# Patient Record
Sex: Female | Born: 1950 | Race: White | Hispanic: No | Marital: Married
Health system: Southern US, Community
[De-identification: ages and names within clinical notes are randomized; demographics above are authoritative.]

---

## 1998-09-30 ENCOUNTER — Other Ambulatory Visit: Admission: RE | Admit: 1998-09-30 | Discharge: 1998-09-30 | Payer: Self-pay | Admitting: Obstetrics & Gynecology

## 2001-11-13 ENCOUNTER — Other Ambulatory Visit: Admission: RE | Admit: 2001-11-13 | Discharge: 2001-11-13 | Payer: Self-pay | Admitting: Obstetrics & Gynecology

## 2003-10-14 ENCOUNTER — Other Ambulatory Visit: Admission: RE | Admit: 2003-10-14 | Discharge: 2003-10-14 | Payer: Self-pay | Admitting: Obstetrics & Gynecology

## 2004-10-25 ENCOUNTER — Other Ambulatory Visit: Admission: RE | Admit: 2004-10-25 | Discharge: 2004-10-25 | Payer: Self-pay | Admitting: Obstetrics & Gynecology

## 2005-04-12 ENCOUNTER — Encounter: Admission: RE | Admit: 2005-04-12 | Discharge: 2005-04-12 | Payer: Self-pay | Admitting: Obstetrics & Gynecology

## 2005-11-09 ENCOUNTER — Encounter: Admission: RE | Admit: 2005-11-09 | Discharge: 2005-11-09 | Payer: Self-pay | Admitting: Obstetrics & Gynecology

## 2006-11-13 ENCOUNTER — Encounter: Admission: RE | Admit: 2006-11-13 | Discharge: 2006-11-13 | Payer: Self-pay | Admitting: Obstetrics & Gynecology

## 2009-09-29 ENCOUNTER — Encounter: Admission: RE | Admit: 2009-09-29 | Discharge: 2009-09-29 | Payer: Self-pay | Admitting: Obstetrics & Gynecology

## 2010-10-05 ENCOUNTER — Encounter
Admission: RE | Admit: 2010-10-05 | Discharge: 2010-10-05 | Payer: Self-pay | Source: Home / Self Care | Admitting: Obstetrics & Gynecology

## 2010-11-20 ENCOUNTER — Encounter: Payer: Self-pay | Admitting: Obstetrics & Gynecology

## 2011-09-07 ENCOUNTER — Other Ambulatory Visit: Payer: Self-pay | Admitting: Obstetrics & Gynecology

## 2011-09-07 DIAGNOSIS — Z1231 Encounter for screening mammogram for malignant neoplasm of breast: Secondary | ICD-10-CM

## 2011-10-18 ENCOUNTER — Ambulatory Visit
Admission: RE | Admit: 2011-10-18 | Discharge: 2011-10-18 | Disposition: A | Discharge status: Home / Self Care | Payer: Private Health Insurance - Indemnity | Source: Ambulatory Visit | Attending: Obstetrics & Gynecology | Admitting: Obstetrics & Gynecology

## 2011-10-18 DIAGNOSIS — Z1231 Encounter for screening mammogram for malignant neoplasm of breast: Secondary | ICD-10-CM

## 2012-11-20 ENCOUNTER — Other Ambulatory Visit: Payer: Self-pay | Admitting: Family Medicine

## 2012-11-20 DIAGNOSIS — Z1231 Encounter for screening mammogram for malignant neoplasm of breast: Secondary | ICD-10-CM

## 2012-12-03 ENCOUNTER — Ambulatory Visit
Admission: RE | Admit: 2012-12-03 | Discharge: 2012-12-03 | Disposition: A | Discharge status: Home / Self Care | Payer: Managed Care, Other (non HMO) | Source: Ambulatory Visit | Attending: Family Medicine | Admitting: Family Medicine

## 2012-12-03 DIAGNOSIS — Z1231 Encounter for screening mammogram for malignant neoplasm of breast: Secondary | ICD-10-CM

## 2014-11-30 ENCOUNTER — Other Ambulatory Visit: Payer: Self-pay

## 2014-11-30 DIAGNOSIS — Z1231 Encounter for screening mammogram for malignant neoplasm of breast: Secondary | ICD-10-CM

## 2014-12-07 ENCOUNTER — Ambulatory Visit
Admission: RE | Admit: 2014-12-07 | Discharge: 2014-12-07 | Disposition: A | Discharge status: Home / Self Care | Payer: Managed Care, Other (non HMO) | Source: Ambulatory Visit

## 2014-12-07 DIAGNOSIS — Z1231 Encounter for screening mammogram for malignant neoplasm of breast: Secondary | ICD-10-CM

## 2017-05-03 ENCOUNTER — Other Ambulatory Visit: Payer: Self-pay | Admitting: Physician Assistant

## 2017-05-03 DIAGNOSIS — Z1231 Encounter for screening mammogram for malignant neoplasm of breast: Secondary | ICD-10-CM

## 2017-05-15 ENCOUNTER — Ambulatory Visit: Payer: BLUE CROSS/BLUE SHIELD

## 2017-05-28 ENCOUNTER — Ambulatory Visit
Admission: RE | Admit: 2017-05-28 | Discharge: 2017-05-28 | Disposition: A | Discharge status: Home / Self Care | Payer: Medicare HMO | Source: Ambulatory Visit | Attending: Physician Assistant | Admitting: Physician Assistant

## 2017-05-28 ENCOUNTER — Encounter: Payer: Self-pay | Admitting: Radiology

## 2017-05-28 DIAGNOSIS — Z1231 Encounter for screening mammogram for malignant neoplasm of breast: Secondary | ICD-10-CM

## 2018-05-21 ENCOUNTER — Other Ambulatory Visit: Payer: Self-pay | Admitting: Physician Assistant

## 2018-05-21 DIAGNOSIS — Z1231 Encounter for screening mammogram for malignant neoplasm of breast: Secondary | ICD-10-CM

## 2018-06-17 ENCOUNTER — Ambulatory Visit: Payer: Medicare HMO

## 2018-06-24 ENCOUNTER — Ambulatory Visit
Admission: RE | Admit: 2018-06-24 | Discharge: 2018-06-24 | Disposition: A | Discharge status: Home / Self Care | Payer: Medicare HMO | Source: Ambulatory Visit | Attending: Physician Assistant | Admitting: Physician Assistant

## 2018-06-24 DIAGNOSIS — Z1231 Encounter for screening mammogram for malignant neoplasm of breast: Secondary | ICD-10-CM

## 2018-06-26 ENCOUNTER — Other Ambulatory Visit: Payer: Self-pay | Admitting: Physician Assistant

## 2018-06-26 DIAGNOSIS — E2839 Other primary ovarian failure: Secondary | ICD-10-CM

## 2018-08-19 ENCOUNTER — Ambulatory Visit
Admission: RE | Admit: 2018-08-19 | Discharge: 2018-08-19 | Disposition: A | Discharge status: Home / Self Care | Payer: Medicare HMO | Source: Ambulatory Visit | Attending: Physician Assistant | Admitting: Physician Assistant

## 2018-08-19 DIAGNOSIS — E2839 Other primary ovarian failure: Secondary | ICD-10-CM

## 2019-09-08 ENCOUNTER — Other Ambulatory Visit: Payer: Self-pay | Admitting: Physician Assistant

## 2019-09-08 DIAGNOSIS — Z1231 Encounter for screening mammogram for malignant neoplasm of breast: Secondary | ICD-10-CM

## 2019-09-10 ENCOUNTER — Other Ambulatory Visit: Payer: Self-pay | Admitting: Physician Assistant

## 2019-09-10 DIAGNOSIS — N644 Mastodynia: Secondary | ICD-10-CM

## 2019-09-17 ENCOUNTER — Ambulatory Visit
Admission: RE | Admit: 2019-09-17 | Discharge: 2019-09-17 | Disposition: A | Discharge status: Home / Self Care | Payer: Medicare HMO | Source: Ambulatory Visit | Attending: Physician Assistant | Admitting: Physician Assistant

## 2019-09-17 ENCOUNTER — Other Ambulatory Visit: Payer: Self-pay

## 2019-09-17 DIAGNOSIS — N644 Mastodynia: Secondary | ICD-10-CM

## 2020-10-26 ENCOUNTER — Other Ambulatory Visit: Payer: Self-pay | Admitting: Physician Assistant

## 2020-10-26 DIAGNOSIS — Z1231 Encounter for screening mammogram for malignant neoplasm of breast: Secondary | ICD-10-CM

## 2020-10-27 ENCOUNTER — Other Ambulatory Visit: Payer: Self-pay

## 2020-10-27 ENCOUNTER — Ambulatory Visit
Admission: RE | Admit: 2020-10-27 | Discharge: 2020-10-27 | Disposition: A | Discharge status: Home / Self Care | Payer: Medicare HMO | Source: Ambulatory Visit | Attending: Physician Assistant | Admitting: Physician Assistant

## 2020-10-27 DIAGNOSIS — Z1231 Encounter for screening mammogram for malignant neoplasm of breast: Secondary | ICD-10-CM

## 2021-06-30 IMAGING — MG DIGITAL SCREENING BILAT W/ TOMO W/ CAD
8 series · 9 of 24 positions shown · non-contrast
Comparison: Previous exam(s).

CLINICAL DATA: Screening.

EXAM:
DIGITAL SCREENING BILATERAL MAMMOGRAM WITH TOMO AND CAD

[L CC synth-2D]
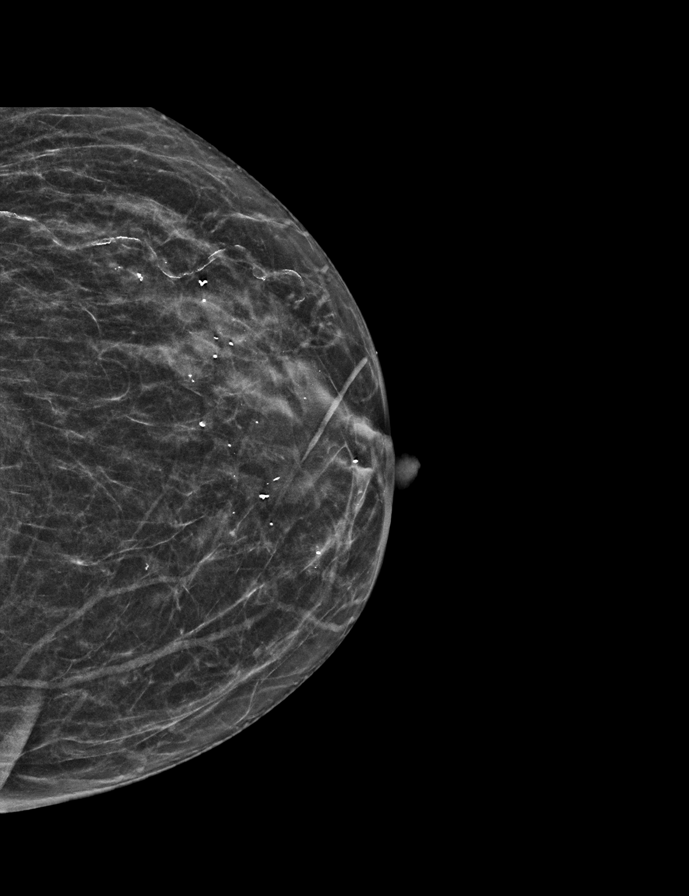

[L MLO synth-2D]
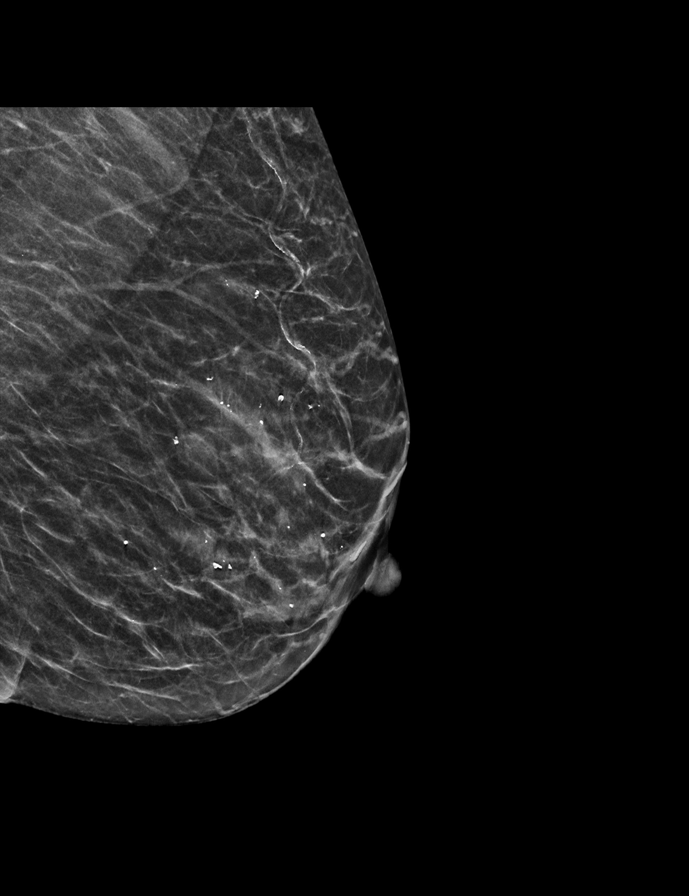

[R CC synth-2D]
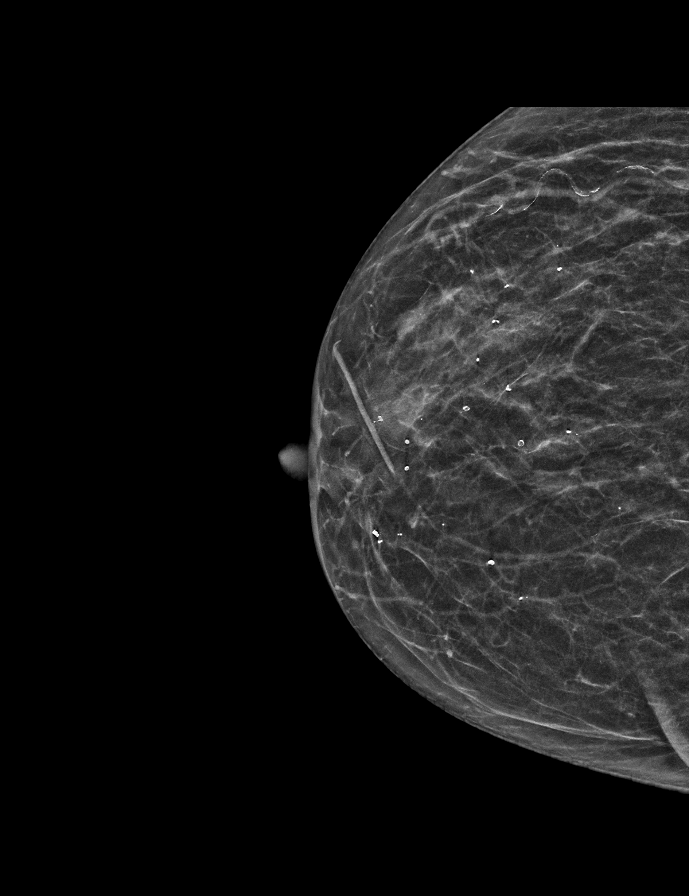

[R MLO synth-2D]
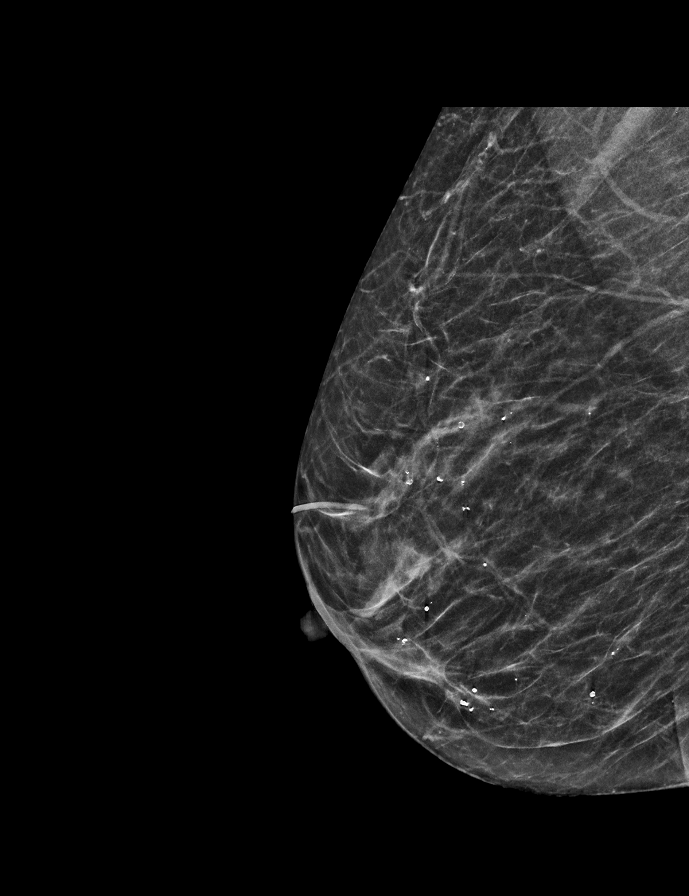

[L CC tomo · 2 of 43 frames shown]
[frame 14/43]
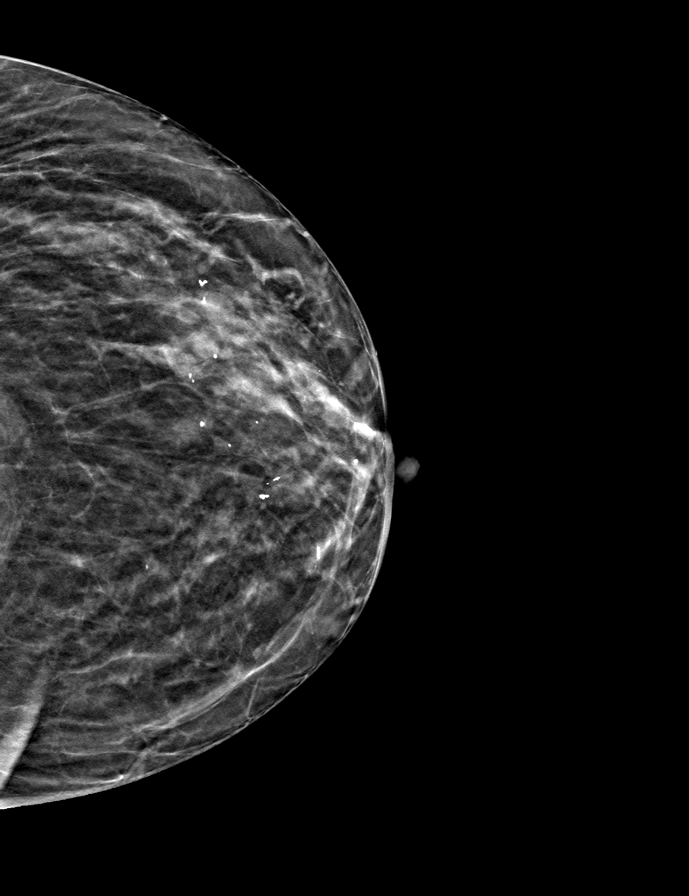
[frame 22/43]
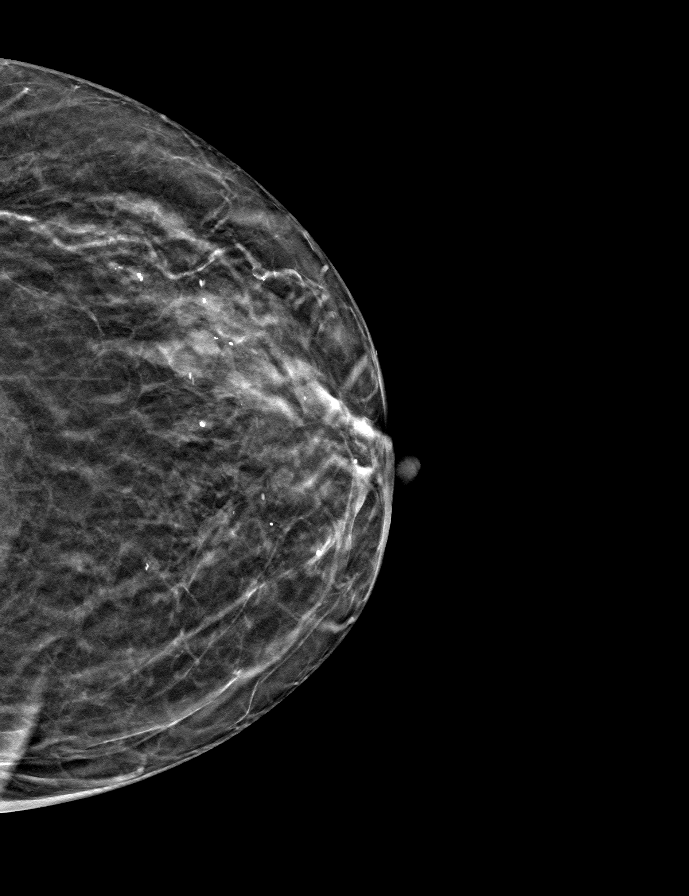

[L MLO tomo · tomo slice 23/44.0]
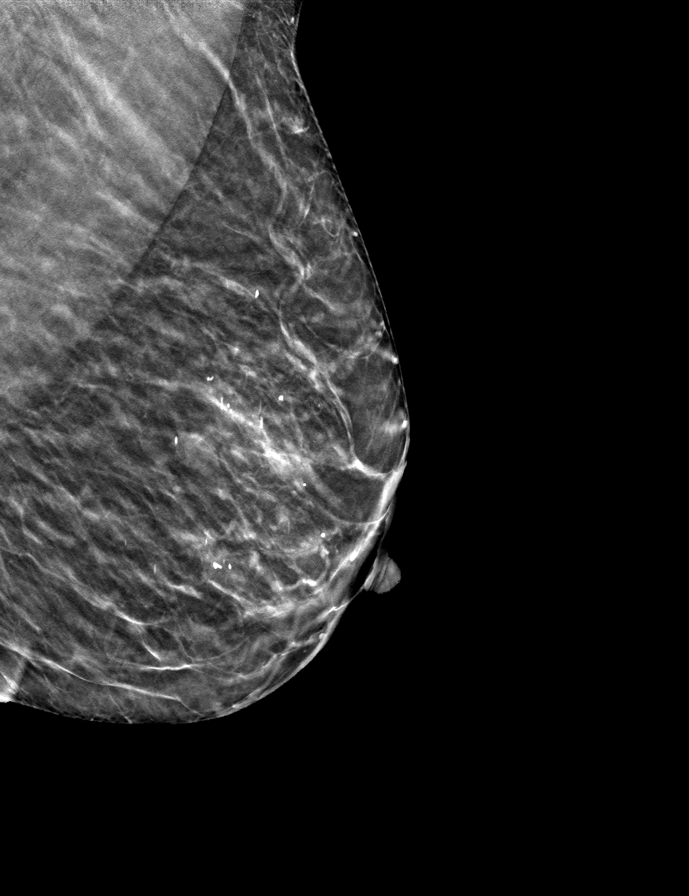

[R CC tomo · tomo slice 22/43.0]
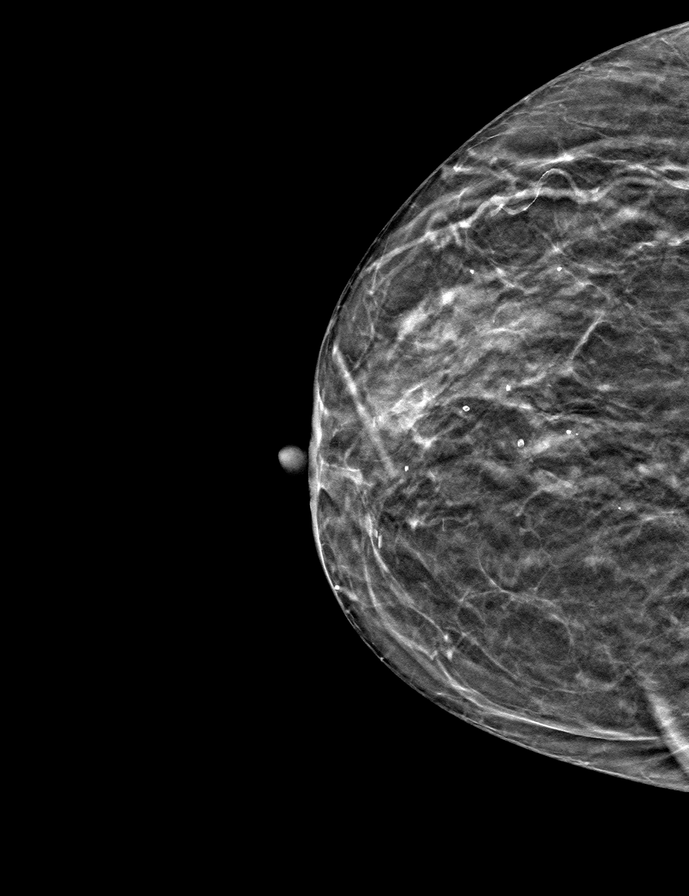

[R MLO tomo · tomo slice 23/45.0]
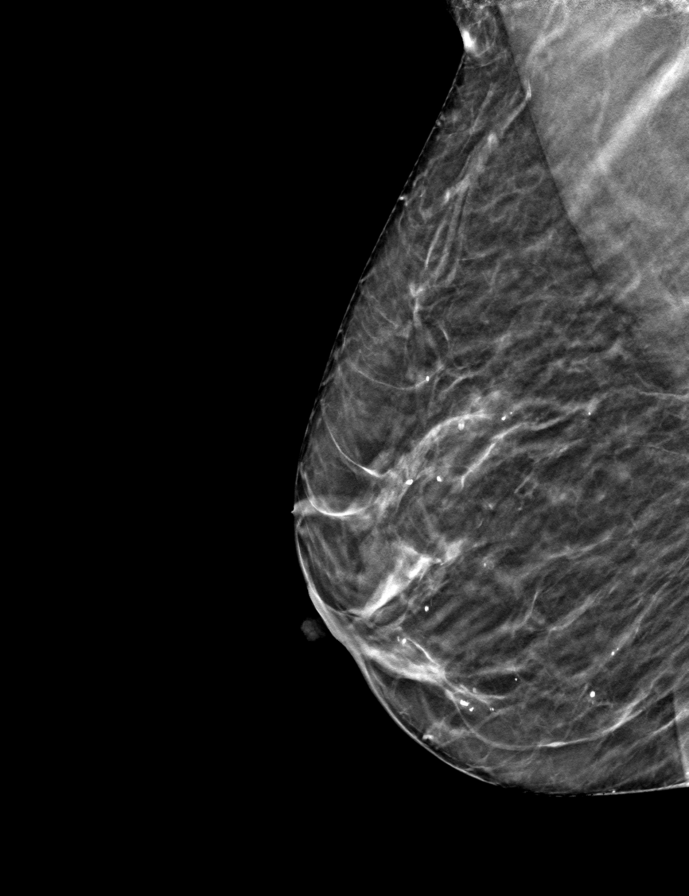

[9 of 24 positions shown; findings below may reference images not displayed]

ACR Breast Density Category b: There are scattered areas of
fibroglandular density.
FINDINGS: There are no findings suspicious for malignancy. Images were
processed with CAD.
IMPRESSION: No mammographic evidence of malignancy. A result letter of this
screening mammogram will be mailed directly to the patient.

RECOMMENDATION:
Screening mammogram in one year. (Code:[id number])

BI-RADS CATEGORY  1: Negative.

## 2021-08-17 ENCOUNTER — Ambulatory Visit: Payer: Medicare HMO | Admitting: Podiatry

## 2021-08-17 ENCOUNTER — Encounter: Payer: Self-pay | Admitting: Podiatry

## 2021-08-17 ENCOUNTER — Ambulatory Visit (INDEPENDENT_AMBULATORY_CARE_PROVIDER_SITE_OTHER): Payer: Medicare HMO

## 2021-08-17 ENCOUNTER — Other Ambulatory Visit: Payer: Self-pay

## 2021-08-17 DIAGNOSIS — M21612 Bunion of left foot: Secondary | ICD-10-CM

## 2021-08-17 DIAGNOSIS — M2041 Other hammer toe(s) (acquired), right foot: Secondary | ICD-10-CM

## 2021-08-17 DIAGNOSIS — M21611 Bunion of right foot: Secondary | ICD-10-CM

## 2021-08-17 DIAGNOSIS — M21619 Bunion of unspecified foot: Secondary | ICD-10-CM | POA: Diagnosis not present

## 2021-08-17 DIAGNOSIS — M2042 Other hammer toe(s) (acquired), left foot: Secondary | ICD-10-CM

## 2021-08-17 NOTE — Progress Notes (Signed)
Subjective:   Patient ID: Amber Horton, female   DOB: 70 y.o.   MRN: [id number]   HPI Patient presents with painful bunion deformity left over right that is hard for her to wear shoe gear with comfortably and digital deformities of the second toes bilateral.  States is getting worse when she tries to hike but is becoming harder for her and they get very sore when she completes.  Patient does not smoke and likes to be active   Review of Systems  All other systems reviewed and are negative.      Objective:  Physical Exam Vitals and nursing note reviewed.  Constitutional:      Appearance: She is well-developed.  Pulmonary:     Effort: Pulmonary effort is normal.  Musculoskeletal:        General: Normal range of motion.  Skin:    General: Skin is warm.  Neurological:     Mental Status: She is alert.    Neurovascular status intact muscle strength adequate range of motion adequate with patient found to have significant structural bunion deformity left over right deviation of the hallux redness around the first metatarsal head and elevated second toes which become painful.  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  I do think structural bunion deformity correction will be necessary along with digital fusion and I did discuss distal osteotomy versus proximal fusion I do think working to be able to get adequate correction with distal osteotomy versus fusion and educated her on the differences between these.  She wants surgery wants to wait till December I recommended the left foot to be done first and she will reappoint for distal osteotomy left digital fusion digit to left and will be seen  back  X-rays indicate there is elevation of the intermetatarsal angle bilateral left over right with elevated second digits bilateral moderate rigid contracture

## 2021-08-17 NOTE — Progress Notes (Signed)
G

## 2021-08-17 NOTE — Patient Instructions (Signed)

## 2021-09-14 ENCOUNTER — Telehealth: Payer: Self-pay | Admitting: Urology

## 2021-09-14 NOTE — Telephone Encounter (Signed)
DOS - 10/04/21  [place] BUNIONECTOM[facility] LEFT --- [id number] HAMMERTOE REPAIR 2ND LEFT --- [id number]   [employer] EFFECTIVE DATE - 10/30/20   SPOKE WITH MAY WITH [employer] AND SHE STATED THAT FOR CPT CODES 44920 AND 10071 NO PRIOR AUTH IS REQUIRED.   REF # [id number]

## 2021-09-28 ENCOUNTER — Other Ambulatory Visit: Payer: Self-pay

## 2021-09-28 ENCOUNTER — Ambulatory Visit: Payer: Medicare HMO | Admitting: Podiatry

## 2021-09-28 ENCOUNTER — Encounter: Payer: Self-pay | Admitting: Podiatry

## 2021-09-28 DIAGNOSIS — M21619 Bunion of unspecified foot: Secondary | ICD-10-CM

## 2021-09-28 DIAGNOSIS — M21612 Bunion of left foot: Secondary | ICD-10-CM

## 2021-09-28 DIAGNOSIS — M2041 Other hammer toe(s) (acquired), right foot: Secondary | ICD-10-CM

## 2021-09-28 DIAGNOSIS — M2042 Other hammer toe(s) (acquired), left foot: Secondary | ICD-10-CM | POA: Diagnosis not present

## 2021-09-28 NOTE — Progress Notes (Signed)
Subjective:   Patient ID: Amber Horton, female   DOB: 70 y.o.   MRN: [id number]   HPI Patient presents stating her bunion has been bothering her and toe and she is excited for correction stating that she has tried wider shoes she is tried soaks trimming without relief of symptoms    ROS      Objective:  Physical Exam  Neurovascular status was found to be intact patient is found to have a large hyperostosis medial aspect first metatarsal head left over right elevated second toes bilateral with keratotic lesion formation redness and pain.     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  We have opted for distal osteotomy and I did explain we may not get full correction but I do think it worked well for her.  I allowed her to be correction and I allowed her to read consent form going over alternative treatments complications associated distal osteotomy digital fusion and after extensive review she signed consent form.  I dispensed air fracture walker that I wanted to get used to prior to surgery and it was fitted properly for her and I discussed that total recovery can take 6 months.  She is amendable to surgery all preoperative instructions given to her and she is encouraged to call questions concerns

## 2021-10-03 MED ORDER — ONDANSETRON HCL 4 MG PO TABS: 4.0000 mg | ORAL_TABLET | Freq: Three times a day (TID) | ORAL | 0 refills | Status: DC | PRN

## 2021-10-03 MED ORDER — OXYCODONE-ACETAMINOPHEN 10-325 MG PO TABS: 1.0000 | ORAL_TABLET | ORAL | 0 refills | Status: DC | PRN

## 2021-10-03 NOTE — Addendum Note (Signed)
Addended by: Lenn Sink on: 10/03/2021 01:09 PM   Modules accepted: Orders

## 2021-10-04 ENCOUNTER — Encounter: Payer: Self-pay | Admitting: Podiatry

## 2021-10-04 DIAGNOSIS — M2042 Other hammer toe(s) (acquired), left foot: Secondary | ICD-10-CM

## 2021-10-04 DIAGNOSIS — M2012 Hallux valgus (acquired), left foot: Secondary | ICD-10-CM

## 2021-10-10 ENCOUNTER — Encounter: Payer: Self-pay | Admitting: Podiatry

## 2021-10-10 ENCOUNTER — Ambulatory Visit (INDEPENDENT_AMBULATORY_CARE_PROVIDER_SITE_OTHER): Payer: Medicare HMO | Admitting: Podiatry

## 2021-10-10 ENCOUNTER — Other Ambulatory Visit: Payer: Self-pay

## 2021-10-10 ENCOUNTER — Ambulatory Visit (INDEPENDENT_AMBULATORY_CARE_PROVIDER_SITE_OTHER): Payer: Medicare HMO

## 2021-10-10 DIAGNOSIS — Z9889 Other specified postprocedural states: Secondary | ICD-10-CM

## 2021-10-10 NOTE — Progress Notes (Signed)
Subjective:   Patient ID: Amber Horton, female   DOB: 70 y.o.   MRN: [id number]   HPI Patient presents stating she is doing okay still having some discomfort but overall is improving   ROS      Objective:  Physical Exam  Neurovascular status intact negative Denna Haggard' sign was noted with patient's left foot healing well wound edges well coapted hallux in rectus position with pin in place second digit good alignment     Assessment:  Doing well post osteotomy digital fusion left     Plan:  H&P reviewed excellent correction with patient and want her to continue to be immobilized completely at this time.  I did discuss the continued boot usage I reapplied sterile dressing and dispensed surgical shoe which she may gradually go into over the next couple weeks.  Patient will be seen back to recheck encouraged to call questions concerns before being seen back for suture removal and x-ray  X-rays indicate excellent reduction of the intermetatarsal angle pin in place second toe and we will continue to plantar flex the toe

## 2021-10-26 ENCOUNTER — Encounter: Payer: Self-pay | Admitting: Podiatry

## 2021-10-26 ENCOUNTER — Ambulatory Visit (INDEPENDENT_AMBULATORY_CARE_PROVIDER_SITE_OTHER): Payer: Medicare HMO | Admitting: Podiatry

## 2021-10-26 ENCOUNTER — Other Ambulatory Visit: Payer: Self-pay

## 2021-10-26 ENCOUNTER — Ambulatory Visit (INDEPENDENT_AMBULATORY_CARE_PROVIDER_SITE_OTHER): Payer: Medicare HMO

## 2021-10-26 DIAGNOSIS — Z9889 Other specified postprocedural states: Secondary | ICD-10-CM

## 2021-10-26 NOTE — Progress Notes (Signed)
Subjective:   Patient ID: Amber Horton, female   DOB: 70 y.o.   MRN: [id number]   HPI Patient presents stating she is doing well with surgery very pleased and here first suture removal   ROS      Objective:  Physical Exam  Neurovascular status intact patient did bump her foot and she is concerned about movement with good alignment noted and pin intact second digit     Assessment:  Doing well post digital fusion metatarsal osteotomy left     Plan:  H&P reviewed condition and recommended elevation compression immobilization to be continued with sutures removed today wound edges coapted well  X-rays indicate no damage from the injury with fixation in place pin in place second toe good alignment of the first MPJ

## 2021-11-09 ENCOUNTER — Ambulatory Visit (INDEPENDENT_AMBULATORY_CARE_PROVIDER_SITE_OTHER): Payer: Medicare HMO

## 2021-11-09 ENCOUNTER — Other Ambulatory Visit: Payer: Self-pay

## 2021-11-09 ENCOUNTER — Encounter: Payer: Self-pay | Admitting: Podiatry

## 2021-11-09 ENCOUNTER — Ambulatory Visit (INDEPENDENT_AMBULATORY_CARE_PROVIDER_SITE_OTHER): Payer: Medicare HMO | Admitting: Podiatry

## 2021-11-09 DIAGNOSIS — Z9889 Other specified postprocedural states: Secondary | ICD-10-CM

## 2021-11-09 DIAGNOSIS — M2012 Hallux valgus (acquired), left foot: Secondary | ICD-10-CM

## 2021-11-09 NOTE — Progress Notes (Signed)
Subjective:   Patient ID: Amber Horton, female   DOB: 71 y.o.   MRN: [id number]   HPI Patient presents doing well with surgery stating the pin is intact second digit left neuro   ROS      Objective:  Physical Exam  Vascular status intact negative Bevelyn Buckles' sign noted wound edges healing well left with digit intact pin in place     Assessment:  Doing well post osteotomy first metatarsal left digital fusion second left     Plan:  H&P pin removed sterile dressing applied x-ray taken discussed continuation of reduced activity but increase it begin surgical shoe and then tennis shoe usage and dispensed ankle compression stocking.  Reappoint 6 weeks discussed second foot that she wants to do April May  X-rays indicate osteotomy is healing well digit in good alignment

## 2021-12-19 ENCOUNTER — Other Ambulatory Visit: Payer: Self-pay | Admitting: Physician Assistant

## 2021-12-19 DIAGNOSIS — Z1231 Encounter for screening mammogram for malignant neoplasm of breast: Secondary | ICD-10-CM

## 2021-12-21 ENCOUNTER — Ambulatory Visit (INDEPENDENT_AMBULATORY_CARE_PROVIDER_SITE_OTHER): Payer: Medicare HMO | Admitting: Podiatry

## 2021-12-21 ENCOUNTER — Other Ambulatory Visit: Payer: Self-pay

## 2021-12-21 ENCOUNTER — Ambulatory Visit (INDEPENDENT_AMBULATORY_CARE_PROVIDER_SITE_OTHER): Payer: Medicare HMO

## 2021-12-21 ENCOUNTER — Encounter: Payer: Self-pay | Admitting: Podiatry

## 2021-12-21 DIAGNOSIS — Z9889 Other specified postprocedural states: Secondary | ICD-10-CM

## 2021-12-21 DIAGNOSIS — M2041 Other hammer toe(s) (acquired), right foot: Secondary | ICD-10-CM

## 2021-12-21 DIAGNOSIS — M795 Residual foreign body in soft tissue: Secondary | ICD-10-CM

## 2021-12-21 DIAGNOSIS — M21619 Bunion of unspecified foot: Secondary | ICD-10-CM

## 2021-12-21 DIAGNOSIS — M2042 Other hammer toe(s) (acquired), left foot: Secondary | ICD-10-CM

## 2021-12-21 NOTE — Progress Notes (Signed)
Subjective:   Patient ID: Amber Horton, female   DOB: 71 y.o.   MRN: [id number]   HPI Patient presents stating she is doing very well with her left foot and the only thing she does get is occasional swelling at night if she has been on it all day   ROS      Objective:  Physical Exam  Vascular status intact negative Denna Haggard' sign noted left foot shows good healing of sites good range of motion second toe in good position and not elevated with mild edema consistent with.  Postop     Assessment:  Overall doing well with normal healing processes left foot     Plan:  H&P final x-ray reviewed and recommended continued range of motion exercises gradual return to normal shoe gear and activity but that total recovery will take several more months.  Reappoint as needed  X-rays indicate osteotomy healing well digit in good alignment fixation in place joint congruence

## 2021-12-28 ENCOUNTER — Ambulatory Visit
Admission: RE | Admit: 2021-12-28 | Discharge: 2021-12-28 | Disposition: A | Discharge status: Home / Self Care | Payer: PRIVATE HEALTH INSURANCE | Source: Ambulatory Visit | Attending: Physician Assistant | Admitting: Physician Assistant

## 2021-12-28 DIAGNOSIS — Z1231 Encounter for screening mammogram for malignant neoplasm of breast: Secondary | ICD-10-CM

## 2022-03-15 ENCOUNTER — Telehealth: Payer: Self-pay | Admitting: Urology

## 2022-03-15 NOTE — Telephone Encounter (Signed)
DOS - 04/11/22 ? ?[place] BUNION[facility] RIGHT --- [phone] ?HAMMERTOE REPAIR 2ND RIGHT --- [id number] ?REMOVAL FOREIGN BODY RIGHT --- 10120 ? ?[employer] EFFECTIVE DATE - 10/30/20 ? ?SPOKE WITH SAM T. WITH [employer] AND HE STATED THAT FOR CPT CODES [id number], [id number] AND [id number] NO PRIOR AUTH IS REQUIRED.  ? ?REF # [id number] ?

## 2022-04-10 ENCOUNTER — Encounter: Payer: Medicare HMO | Admitting: Podiatry

## 2022-04-10 MED ORDER — OXYCODONE-ACETAMINOPHEN 10-325 MG PO TABS: 1.0000 | ORAL_TABLET | ORAL | 0 refills | Status: AC | PRN

## 2022-04-10 MED ORDER — ONDANSETRON HCL 4 MG PO TABS: 4.0000 mg | ORAL_TABLET | Freq: Three times a day (TID) | ORAL | 0 refills | Status: AC | PRN

## 2022-04-10 NOTE — Addendum Note (Signed)
Addended by: Wallene Huh on: 04/10/2022 02:06 PM   Modules accepted: Orders

## 2022-04-11 ENCOUNTER — Encounter: Payer: Self-pay | Admitting: Podiatry

## 2022-04-11 DIAGNOSIS — M2011 Hallux valgus (acquired), right foot: Secondary | ICD-10-CM

## 2022-04-11 DIAGNOSIS — L923 Foreign body granuloma of the skin and subcutaneous tissue: Secondary | ICD-10-CM

## 2022-04-11 DIAGNOSIS — M2041 Other hammer toe(s) (acquired), right foot: Secondary | ICD-10-CM

## 2022-04-17 ENCOUNTER — Ambulatory Visit (INDEPENDENT_AMBULATORY_CARE_PROVIDER_SITE_OTHER): Payer: Medicare HMO | Admitting: Podiatry

## 2022-04-17 ENCOUNTER — Encounter: Payer: Self-pay | Admitting: Podiatry

## 2022-04-17 ENCOUNTER — Ambulatory Visit (INDEPENDENT_AMBULATORY_CARE_PROVIDER_SITE_OTHER): Payer: Medicare HMO

## 2022-04-17 DIAGNOSIS — Z9889 Other specified postprocedural states: Secondary | ICD-10-CM

## 2022-04-17 NOTE — Progress Notes (Signed)
Subjective:   Patient ID: Amber Horton, female   DOB: 71 y.o.   MRN: [id number]   HPI Patient states doing excellent with surgery very pleased so far   ROS      Objective:  Physical Exam  Neurovascular status intact negative Denna Haggard' sign noted wound edges well coapted hallux rectus position second toe good alignment pin in place stitches intact     Assessment:  Doing well post forefoot surgery right     Plan:  H&P reviewed condition recommended the continuation of elevation anti-inflammatories as needed compression and immobilization reappoint 2 weeks suture removal 4 weeks pin removal  X-rays indicate osteotomies healing well pins in place good alignment joint congruence

## 2022-05-04 ENCOUNTER — Ambulatory Visit (INDEPENDENT_AMBULATORY_CARE_PROVIDER_SITE_OTHER): Payer: Medicare HMO | Admitting: Podiatry

## 2022-05-04 DIAGNOSIS — Z9889 Other specified postprocedural states: Secondary | ICD-10-CM

## 2022-05-04 NOTE — Progress Notes (Signed)
Patient seen in our office today for stitch removal to right forefoot. Patient denies any chills, nausea, vomiting, fever or chest pain at this time. Patient tolerated procedure well. Cleansed area with iodine and covered with telfa dressing, followed by DSD. Advised patient to keep area dry for at least two days and after two days he will be able to wash with soap and water.   Answered all questions at this visit.  Advised patient to call office with any concerns. If there is any signs or symptoms of infection to call office or go to the ER. Patient verbalize understanding.

## 2022-05-15 ENCOUNTER — Encounter: Payer: Medicare HMO | Admitting: Podiatry

## 2022-05-15 ENCOUNTER — Ambulatory Visit (INDEPENDENT_AMBULATORY_CARE_PROVIDER_SITE_OTHER): Payer: Medicare HMO

## 2022-05-15 ENCOUNTER — Ambulatory Visit (INDEPENDENT_AMBULATORY_CARE_PROVIDER_SITE_OTHER): Payer: Medicare HMO | Admitting: Podiatry

## 2022-05-15 DIAGNOSIS — Z9889 Other specified postprocedural states: Secondary | ICD-10-CM

## 2022-05-15 NOTE — Progress Notes (Signed)
Subjective:   Patient ID: Amber Horton, female   DOB: 71 y.o.   MRN: [id number]   HPI Patient presents she is here to have the pin removed second digit right foot doing very well so far with surgery with moderate swelling still noted   ROS      Objective:  Physical Exam  Neurovascular status intact muscle strength found to be adequate range of motion adequate.  Negative Homans' sign noted.  Patient's right second digit is found to be in good alignment pin intact first MPJ healing well wound edges well coapted     Assessment:  Doing well post forefoot reconstruction right foot     Plan:  H&P condition reviewed with patient and I went ahead and I remove the pin second digit I applied sterile dressing to the toe and I instructed on keeping the toe down and begin range of motion first MPJ.  Patient may gradually start wearing shoe gear and is encouraged to call with questions concerns which may arise and will be seen back 4 weeks or earlier if needed

## 2022-06-30 ENCOUNTER — Other Ambulatory Visit: Payer: Medicare HMO

## 2022-07-04 ENCOUNTER — Other Ambulatory Visit: Payer: Self-pay | Admitting: Podiatry

## 2022-07-04 ENCOUNTER — Ambulatory Visit (INDEPENDENT_AMBULATORY_CARE_PROVIDER_SITE_OTHER): Payer: Medicare HMO | Admitting: Podiatry

## 2022-07-04 ENCOUNTER — Ambulatory Visit (INDEPENDENT_AMBULATORY_CARE_PROVIDER_SITE_OTHER): Payer: Medicare HMO

## 2022-07-04 DIAGNOSIS — Z9889 Other specified postprocedural states: Secondary | ICD-10-CM

## 2022-07-04 NOTE — Progress Notes (Signed)
Patient in office for final post operative visit  DOS 04/11/22: [place] Bunionectomy, Hammertoe repair 2nd and removal of foreign body Right foot.  Patient doing well post-op with moderate to mild swelling noted. Patient denies nausea, vomiting, fever, chills and pain. Range of motion adequate in the right hallux. Encouraged patient to continue range of motion exercises and wearing compression  sleeve to decrease swelling. Patient presented today with supportive shoes.   Patient verbalized understanding instructions.   X-ray of right foot obtained and reviewed by Dr. Annamary Rummage. Attention to the 1st metatarsal there is healed distal 1st metatarsal osteotomy with complete osseous bridging across the osteotomy. Normal aligment of the 1st MPJ with no significant arthritic changes at the joint. K wire fixation is intact without complication. Impression healed distal 1st metatarsal osteotomy bunionectomy.

## 2023-04-03 ENCOUNTER — Encounter: Payer: Self-pay | Admitting: Physician Assistant

## 2023-04-03 DIAGNOSIS — Z0000 Encounter for general adult medical examination without abnormal findings: Secondary | ICD-10-CM

## 2023-04-20 ENCOUNTER — Other Ambulatory Visit: Payer: Self-pay | Admitting: Physician Assistant

## 2023-04-20 DIAGNOSIS — M810 Age-related osteoporosis without current pathological fracture: Secondary | ICD-10-CM
